# Patient Record
Sex: Male | Born: 1985 | Hispanic: Yes | Marital: Single | Smoking: Current every day smoker
Health system: Southern US, Community
[De-identification: ages and names within clinical notes are randomized; demographics above are authoritative.]

## PROBLEM LIST (undated history)

## (undated) DIAGNOSIS — F1010 Alcohol abuse, uncomplicated: Secondary | ICD-10-CM

---

## 2015-12-29 ENCOUNTER — Ambulatory Visit (INDEPENDENT_AMBULATORY_CARE_PROVIDER_SITE_OTHER): Payer: Self-pay

## 2015-12-29 ENCOUNTER — Ambulatory Visit (INDEPENDENT_AMBULATORY_CARE_PROVIDER_SITE_OTHER): Payer: Self-pay | Admitting: Family Medicine

## 2015-12-29 VITALS — BP 174/108 | HR 84 | Temp 99.3°F | Resp 20 | Ht 63.78 in | Wt 153.0 lb

## 2015-12-29 DIAGNOSIS — F10230 Alcohol dependence with withdrawal, uncomplicated: Secondary | ICD-10-CM

## 2015-12-29 DIAGNOSIS — R002 Palpitations: Secondary | ICD-10-CM

## 2015-12-29 DIAGNOSIS — Z789 Other specified health status: Secondary | ICD-10-CM

## 2015-12-29 DIAGNOSIS — F10930 Alcohol use, unspecified with withdrawal, uncomplicated: Secondary | ICD-10-CM

## 2015-12-29 LAB — POCT URINALYSIS DIP (MANUAL ENTRY)
BILIRUBIN UA: NEGATIVE
BILIRUBIN UA: NEGATIVE
Blood, UA: NEGATIVE
GLUCOSE UA: NEGATIVE
LEUKOCYTES UA: NEGATIVE
NITRITE UA: NEGATIVE
Protein Ur, POC: 100 — AB
Spec Grav, UA: 1.015
Urobilinogen, UA: 1
pH, UA: 8.5

## 2015-12-29 LAB — POCT CBC
GRANULOCYTE PERCENT: 74 % (ref 37–80)
HEMATOCRIT: 47 % (ref 43.5–53.7)
Hemoglobin: 15.9 g/dL (ref 14.1–18.1)
LYMPH, POC: 1.9 (ref 0.6–3.4)
MCH, POC: 31.2 pg (ref 27–31.2)
MCHC: 33.9 g/dL (ref 31.8–35.4)
MCV: 92 fL (ref 80–97)
MID (cbc): 0.6 (ref 0–0.9)
MPV: 7.9 fL (ref 0–99.8)
POC GRANULOCYTE: 7.3 — AB (ref 2–6.9)
POC LYMPH %: 19.5 % (ref 10–50)
POC MID %: 6.5 % (ref 0–12)
Platelet Count, POC: 318 10*3/uL (ref 142–424)
RBC: 5.11 M/uL (ref 4.69–6.13)
RDW, POC: 12.5 %
WBC: 9.8 10*3/uL (ref 4.6–10.2)

## 2015-12-29 LAB — GLUCOSE, POCT (MANUAL RESULT ENTRY): POC Glucose: 115 mg/dl — AB (ref 70–99)

## 2015-12-29 MED ORDER — DIAZEPAM 5 MG PO TABS: 5.0000 mg | ORAL_TABLET | Freq: Two times a day (BID) | ORAL | Status: AC

## 2015-12-29 NOTE — Patient Instructions (Signed)
    Porque has recibido [employer], usted recibir una factura de [employer]. Pngase en contacto con [facility] radiologa en [phone] con preguntas o inquietudes acerca de su factura. Nuestro personal de facturacin no ser capaz de ayudarle con las preguntas

## 2015-12-29 NOTE — Progress Notes (Signed)
12/30/2015 1:18 PM   DOB: [birth year] / MRN: [id number]  SUBJECTIVE:  Johnny Gross is a 30 y.o. male presenting for palpitations that he associates with chest pain.  States he has been drinking excessively for fun over the last two weeks and feels these symptoms started after that. Associates mild nausea, HA and SOB.  No exertional component to his symptoms.  Feels this problem is worsening.  His last drink was two days ago.       He has No Known Allergies.   He  has no past medical history on file.    He  reports that he has been smoking.  He does not have any smokeless tobacco history on file. He reports that he drinks about 30.0 oz of alcohol per week. He reports that he does not use illicit drugs. He  has no sexual activity history on file. The patient  has no past surgical history on file.  His family history includes Diabetes in his father, maternal grandmother, and mother.  Review of Systems  Constitutional: Negative for fever and chills.  Eyes: Negative for blurred vision.  Respiratory: Negative for cough.   Cardiovascular: Positive for chest pain and palpitations. Negative for leg swelling.  Gastrointestinal: Negative for nausea.  Musculoskeletal: Positive for myalgias.  Skin: Negative for itching and rash.  Neurological: Negative for dizziness and headaches.    Problem list and medications reviewed and updated by myself where necessary, and exist elsewhere in the encounter.   OBJECTIVE:  BP 174/108 mmHg  Pulse 84  Temp(Src) 99.3 F (37.4 C) (Oral)  Resp 20  Ht 5' 3.78" (1.62 m)  Wt 153 lb (69.4 kg)  BMI 26.44 kg/m2  SpO2 98%  Physical Exam  Constitutional: He is oriented to person, place, and time. He appears well-developed. He does not appear ill.  HENT:  Right Ear: External ear normal.  Left Ear: External ear normal.  Eyes: Conjunctivae and EOM are normal. Pupils are equal, round, and reactive to light.  Cardiovascular: Normal rate.   Pulmonary/Chest: Effort  normal.  Abdominal: He exhibits no distension. There is no tenderness.  Musculoskeletal: Normal range of motion.  Neurological: He is alert and oriented to person, place, and time. No cranial nerve deficit. Coordination normal.  Skin: Skin is warm and dry. He is not diaphoretic.  Psychiatric: He has a normal mood and affect.  Nursing note and vitals reviewed.   Results for orders placed or performed in visit on 12/29/15 (from the past 72 hour(s))  POCT CBC     Status: Abnormal   Collection Time: 12/29/15  8:38 PM  Result Value Ref Range   WBC 9.8 4.6 - 10.2 K/uL   Lymph, poc 1.9 0.6 - 3.4   POC LYMPH PERCENT 19.5 10 - 50 %L   MID (cbc) 0.6 0 - 0.9   POC MID % 6.5 0 - 12 %M   POC Granulocyte 7.3 (A) 2 - 6.9   Granulocyte percent 74.0 37 - 80 %G   RBC 5.11 4.69 - 6.13 M/uL   Hemoglobin 15.9 14.1 - 18.1 g/dL   HCT, POC 40.9 81.1 - 53.7 %   MCV 92.0 80 - 97 fL   MCH, POC 31.2 27 - 31.2 pg   MCHC 33.9 31.8 - 35.4 g/dL   RDW, POC 91.4 %   Platelet Count, POC 318 142 - 424 K/uL   MPV 7.9 0 - 99.8 fL  POCT glucose (manual entry)     Status: Abnormal  Collection Time: 12/29/15  8:38 PM  Result Value Ref Range   POC Glucose 115 (A) 70 - 99 mg/dl  POCT urinalysis dipstick     Status: Abnormal   Collection Time: 12/29/15  8:39 PM  Result Value Ref Range   Color, UA yellow yellow   Clarity, UA clear clear   Glucose, UA negative negative   Bilirubin, UA negative negative   Ketones, POC UA negative negative   Spec Grav, UA 1.015    Blood, UA negative negative   pH, UA 8.5    Protein Ur, POC =100 (A) negative   Urobilinogen, UA 1.0    Nitrite, UA Negative Negative   Leukocytes, UA Negative Negative    ASSESSMENT AND PLAN  Johnny Gross was seen today for palpitations, anxiety, dizziness and insomnia.  Diagnoses and all orders for this visit:  Heart palpitations: Secondary to problem 3. EKG and RADS normal.  Pressure elevated.    -     EKG 12-Lead -     POCT CBC -     POCT glucose  (manual entry) -     DG Chest 2 View; Future -     COMPLETE METABOLIC PANEL WITH GFR -     TSH -     POCT urinalysis dipstick -     Lipase  Heavy drinker (HCC)  Alcohol withdrawal, uncomplicated (HCC):  Negative for tremor at this time.  Will cover him with Valium for the next ten days.  RTC in 3-4 days for recheck.    Other orders -     diazepam (VALIUM) 5 MG tablet; Take 1 tablet (5 mg total) by mouth every 12 (twelve) hours. For five days and then take 0.5 tabs every twelve hours for five days.    The patient was advised to call or return to clinic if he does not see an improvement in symptoms or to seek the care of the closest emergency department if he worsens with the above plan.   Deliah Boston, MHS, PA-C [facility] 12/30/2015 1:18 PM

## 2015-12-30 LAB — COMPLETE METABOLIC PANEL WITH GFR
ALT: 72 U/L — ABNORMAL HIGH (ref 9–46)
AST: 39 U/L (ref 10–40)
Albumin: 5 g/dL (ref 3.6–5.1)
Alkaline Phosphatase: 82 U/L (ref 40–115)
BUN: 7 mg/dL (ref 7–25)
CHLORIDE: 98 mmol/L (ref 98–110)
CO2: 26 mmol/L (ref 20–31)
Calcium: 10.4 mg/dL — ABNORMAL HIGH (ref 8.6–10.3)
Creat: 0.52 mg/dL — ABNORMAL LOW (ref 0.60–1.35)
GFR, Est African American: >89 mL/min (ref >=60)
GFR, Est Non African American: >89 mL/min (ref >=60)
GLUCOSE: 116 mg/dL — AB (ref 65–99)
POTASSIUM: 4.6 mmol/L (ref 3.5–5.3)
SODIUM: 136 mmol/L (ref 135–146)
Total Bilirubin: 1.1 mg/dL (ref 0.2–1.2)
Total Protein: 8.2 g/dL — ABNORMAL HIGH (ref 6.1–8.1)

## 2015-12-30 LAB — TSH: TSH: 2.542 u[IU]/mL (ref 0.350–4.500)

## 2015-12-30 LAB — LIPASE: Lipase: 12 U/L (ref 7–60)

## 2016-01-06 NOTE — Progress Notes (Signed)
Patient ID: Johnny Gross, male   DOB: 11/10/1986, 30 y.o.   MRN: [id number] Reviewed documentation, EKG, and xray and agree w/ assessment and plan. Norberto Sorenson, MD MPH

## 2016-02-15 ENCOUNTER — Emergency Department (HOSPITAL_COMMUNITY)
Admission: EM | Admit: 2016-02-15 | Discharge: 2016-02-15 | Disposition: A | Discharge status: Home / Self Care | Payer: Self-pay | Attending: Emergency Medicine | Admitting: Emergency Medicine

## 2016-02-15 ENCOUNTER — Encounter (HOSPITAL_COMMUNITY): Payer: Self-pay | Admitting: Emergency Medicine

## 2016-02-15 DIAGNOSIS — R002 Palpitations: Secondary | ICD-10-CM | POA: Insufficient documentation

## 2016-02-15 DIAGNOSIS — F1010 Alcohol abuse, uncomplicated: Secondary | ICD-10-CM | POA: Insufficient documentation

## 2016-02-15 DIAGNOSIS — R110 Nausea: Secondary | ICD-10-CM | POA: Insufficient documentation

## 2016-02-15 DIAGNOSIS — R450 Nervousness: Secondary | ICD-10-CM | POA: Insufficient documentation

## 2016-02-15 DIAGNOSIS — F17200 Nicotine dependence, unspecified, uncomplicated: Secondary | ICD-10-CM | POA: Insufficient documentation

## 2016-02-15 HISTORY — DX: Alcohol abuse, uncomplicated: F10.10

## 2016-02-15 LAB — COMPREHENSIVE METABOLIC PANEL
ALBUMIN: 4.6 g/dL (ref 3.5–5.0)
ALK PHOS: 86 U/L (ref 38–126)
ALT: 44 U/L (ref 17–63)
AST: 41 U/L (ref 15–41)
Anion gap: 12 (ref 5–15)
BILIRUBIN TOTAL: 0.9 mg/dL (ref 0.3–1.2)
BUN: 12 mg/dL (ref 6–20)
CO2: 27 mmol/L (ref 22–32)
CREATININE: 0.76 mg/dL (ref 0.61–1.24)
Calcium: 9.9 mg/dL (ref 8.9–10.3)
Chloride: 99 mmol/L — ABNORMAL LOW (ref 101–111)
GFR calc Af Amer: >60 mL/min (ref >60)
GLUCOSE: 131 mg/dL — AB (ref 65–99)
POTASSIUM: 4 mmol/L (ref 3.5–5.1)
SODIUM: 138 mmol/L (ref 135–145)
TOTAL PROTEIN: 8 g/dL (ref 6.5–8.1)

## 2016-02-15 LAB — ETHANOL: Alcohol, Ethyl (B): <5 mg/dL (ref <5)

## 2016-02-15 LAB — CBC WITH DIFFERENTIAL/PLATELET
Basophils Absolute: 0 10*3/uL (ref 0.0–0.1)
Basophils Relative: 0 %
EOS ABS: 0.2 10*3/uL (ref 0.0–0.7)
EOS PCT: 2 %
HCT: 45.9 % (ref 39.0–52.0)
Hemoglobin: 15.6 g/dL (ref 13.0–17.0)
LYMPHS ABS: 2.5 10*3/uL (ref 0.7–4.0)
Lymphocytes Relative: 26 %
MCH: 31.2 pg (ref 26.0–34.0)
MCHC: 34 g/dL (ref 30.0–36.0)
MCV: 91.8 fL (ref 78.0–100.0)
MONO ABS: 0.7 10*3/uL (ref 0.1–1.0)
MONOS PCT: 8 %
Neutro Abs: 6.2 10*3/uL (ref 1.7–7.7)
Neutrophils Relative %: 64 %
PLATELETS: 269 10*3/uL (ref 150–400)
RBC: 5 MIL/uL (ref 4.22–5.81)
RDW: 12 % (ref 11.5–15.5)
WBC: 9.7 10*3/uL (ref 4.0–10.5)

## 2016-02-15 LAB — LIPASE, BLOOD: LIPASE: 22 U/L (ref 11–51)

## 2016-02-15 MED ORDER — ONDANSETRON 4 MG PO TBDP
4.0000 mg | ORAL_TABLET | Freq: Once | ORAL | Status: AC
  Administered 2016-02-15: 4 mg via ORAL
  Filled 2016-02-15: qty 1

## 2016-02-15 MED ORDER — ONDANSETRON HCL 4 MG PO TABS: 4.0000 mg | ORAL_TABLET | Freq: Four times a day (QID) | ORAL | Status: AC

## 2016-02-15 MED ORDER — LORAZEPAM 1 MG PO TABS
1.0000 mg | ORAL_TABLET | Freq: Once | ORAL | Status: AC
  Administered 2016-02-15: 1 mg via ORAL
  Filled 2016-02-15: qty 1

## 2016-02-15 NOTE — ED Notes (Signed)
Pt. reports anxiety attack " I feel nervous"  this evening , calm at triage , denies hallucinations .

## 2016-02-15 NOTE — ED Provider Notes (Signed)
CSN: [id number]     Arrival date & time 02/15/16  0052 History   First MD Initiated Contact with Patient 02/15/16 [phone]     Chief Complaint  Patient presents with  . Anxiety   (Consider location/radiation/quality/duration/timing/severity/associated sxs/prior Treatment) Patient is a 30 y.o. male presenting with anxiety. The history is provided by the patient. No language interpreter was used.  Anxiety Associated symptoms include nausea. Pertinent negatives include no vomiting.    [name] is a 30 year old male with no significant past medical history who presents for feeling nervous the palpitations since yesterday afternoon. He reports drinking 12 beers prior to this. He states that he does not drink daily [facility] He is only drank alcohol 3 times since but has drank a lot of alcohol at once. He reports feeling this way 2-3 times previously and was evaluated in urgent care and sent home. He was able to tolerate fluids and eat yesterday without vomiting but says that he has nauseous now. He also reports that his palpitations have since resolved. He denies any drug use. He denies any recent illness, fever, chills, shortness of breath, abdominal pain, vomiting, diarrhea.  Past Medical History  Diagnosis Date  . Alcohol abuse    History reviewed. No pertinent past surgical history. Family History  Problem Relation Age of Onset  . Diabetes Mother   . Diabetes Father   . Diabetes Maternal Grandmother    Social History  Substance Use Topics  . Smoking status: Current Every Day Smoker -- 1 years  . Smokeless tobacco: None  . Alcohol Use: Yes    Review of Systems  Cardiovascular: Positive for palpitations.  Gastrointestinal: Positive for nausea. Negative for vomiting.  All other systems reviewed and are negative.     Allergies  Review of patient's allergies indicates no known allergies.  Home Medications   Prior to Admission medications   Medication Sig Start  Date End Date Taking? Authorizing Provider  diazepam (VALIUM) 5 MG tablet Take 1 tablet (5 mg total) by mouth every 12 (twelve) hours. For five days and then take 0.5 tabs every twelve hours for five days. 12/29/15   Ofilia NeasMichael L Clark, PA-C  ondansetron (ZOFRAN) 4 MG tablet Take 1 tablet (4 mg total) by mouth every 6 (six) hours. 02/15/16   Tayelor Osborne Patel-Mills, PA-C   BP 108/68 mmHg  Pulse 69  Temp(Src) 97.8 F (36.6 C) (Oral)  Resp 18  Ht 5\' 5"  (1.651 m)  Wt 68.04 kg  BMI 24.96 kg/m2  SpO2 95% Physical Exam  Constitutional: He is oriented to person, place, and time. He appears well-developed and well-nourished. No distress.  HENT:  Head: Normocephalic and atraumatic.  Eyes: Right conjunctiva is injected. Left conjunctiva is injected.  Bilateral injected conjunctiva. No hemorrhage.  Neck: Normal range of motion. Neck supple.  Cardiovascular: Normal rate, regular rhythm and normal heart sounds.   No murmur heard. Regular rate and rhythm. No tachycardia or murmur.  Pulmonary/Chest: Effort normal and breath sounds normal. No respiratory distress. He has no wheezes. He has no rales.  Lungs clear to auscultation bilaterally.  Abdominal: Soft. Normal appearance. He exhibits no distension. There is no tenderness. There is no rebound and no guarding.  No abdominal tenderness to palpation. No guarding or rebound. No distention.  Musculoskeletal: Normal range of motion.  No tremor. Ambulatory with steady gait.  Neurological: He is alert and oriented to person, place, and time.  Skin: Skin is warm and dry. He is not  diaphoretic.  Nursing note and vitals reviewed.   ED Course  Procedures (including critical care time) Labs Review Labs Reviewed  COMPREHENSIVE METABOLIC PANEL - Abnormal; Notable for the following:    Chloride 99 (*)    Glucose, Bld 131 (*)    All other components within normal limits  CBC WITH DIFFERENTIAL/PLATELET  LIPASE, BLOOD  ETHANOL  URINE RAPID DRUG SCREEN, HOSP  PERFORMED    Imaging Review No results found. I have personally reviewed and evaluated these lab results as part of my medical decision-making.   EKG Interpretation   Date/Time:  Sunday February 15 2016 08:19:20 EDT Ventricular Rate:  76 PR Interval:  180 QRS Duration: 82 QT Interval:  355 QTC Calculation: 399 R Axis:   54 Text Interpretation:  Sinus rhythm ST elev, probable normal early repol  pattern No previous ECGs available Confirmed by Manus Gunning  MD, STEPHEN  [phone]) on 02/15/2016 8:27:30 AM      MDM   Final diagnoses:  Nervousness  Alcohol abuse  Patient presents for nervousness, palpitations, and nausea since yesterday. He reports drinking 12 beers prior to this. Denies any use of drugs or liquor. Vitals stable and patient is well-appearing. I ordered labs to evaluate liver function and pancreatic enzymes. All labs are unremarkable. I do not believe patient is in DTs. His EKG is not concerning.  I discussed findings with the patient. I explained that he should stay well-hydrated. I also discussed not binge drinking. Return precautions were thoroughly discussed. Patient agrees with plan. Filed Vitals:   02/15/16 0945 02/15/16 0958  BP: 108/68   Pulse: 69   Temp:  97.8 F (36.6 C)  Resp: [id number], PA-C 02/15/16 1322  Loren Racer, MD 02/16/16 [phone]

## 2016-02-15 NOTE — Discharge Instructions (Signed)
Consumo excesivo de alcohol y nutricin (Alcohol Abuse and Nutrition) El consumo excesivo de alcohol es cualquier patrn de consumo de alcohol que perjudique la salud, las relaciones o [place]. Este trastorno Orthoptist forma en que el organismo descompone y absorbe los nutrientes de los [place], lo que causa el funcionamiento anormal del hgado. Adems, muchas personas que consumen alcohol en exceso no ingieren la cantidad suficiente de carbohidratos, protenas, grasas, vitaminas y minerales. Esto puede causar desnutricin y la falta de nutrientes (carencias nutritivas), lo que tal vez derive en otras complicaciones. Los nutrientes que suelen ser deficitarios en las personas que consumen alcohol en exceso incluyen lo siguiente:  Vitaminas.  VitaminaA, que se almacena en el hgado y es importante para la visin, el metabolismo y la capacidad para combatir las infecciones (inmunidad).  VitaminasB, que incluyen las vitaminas tales como el folato, la tiamina y la niacina, importantes en el crecimiento y la conservacin de las clulas nuevas.  VitaminaC, que desempea un papel importante en la absorcin del hierro, la cicatrizacin de las heridas y la inmunidad.  VitaminaD, que es producida por el hgado y que tambin se puede obtener de los alimentos. La vitaminaD es necesaria para que el organismo absorba y use el calcio.  Minerales.  Calcio. Es importante para los [place], la actividad cardaca y el funcionamiento de los vasos sanguneos (actividad cardiovascular).  Hierro. Es importante para la [place], los msculos y el funcionamiento del sistema nervioso.  Magnesio. Desempea un papel importante en el funcionamiento muscular y nervioso, y [place] a Sales executive nivel de azcar en la sangre y la presin arterial.  Zinc. Es importante para el normal funcionamiento del sistema nervioso y el aparato digestivo (tubo digestivo). La nutricin es un componente fundamental del  tratamiento para el consumo excesivo de alcohol. El mdico o el nutricionista trabajarn con usted para disear un plan que pueda ayudar a que el organismo recupere los nutrientes y a Occupational psychologist. EN QU CONSISTE EL PLAN? El nutricionista Airline pilot un plan de alimentacin especfico en funcin de su afeccin y de otras complicaciones que pueda Warehouse manager. Generalmente, un plan de alimentacin incluye lo siguiente:  Neomia Dear dieta equilibrada.  Cereales: de 6 a 8 onzas por Futures trader.  Verduras: de 2 a Scientist, research (life sciences).  Nils Pyle: de 1 a 2tazas Air cabin crew.  Carne y otras protenas: de 5 a Health visitor.  Lcteos: de 2 a Scientist, research (life sciences).  Suplementos de vitaminas y minerales QU DEBO SABER ACERCA DEL ALCOHOL Y LA NUTRICIN?  Consuma alimentos con alto contenido de antioxidantes, como uvas, frutos rojos, frutos secos, t verde y verduras de [employer] oscuro y anaranjado. Esto puede ayudar a Field seismologist de la agresin que sufre el hgado cuando se consume alcohol.  No ingiera alimentos y bebidas con alto contenido de [place] y International aid/development worker. Las bebidas gaseosas azucaradas, las colaciones saladas y los dulces contienen caloras vacas, lo que significa que carecen de nutrientes importantes, como las protenas, las fibras y las vitaminas.  Ingiera comidas y colaciones con frecuencia. Intente hacer 5 o 6comidas pequeas por da.  Coma diariamente una variedad de frutas y verduras frescas, lo que ayudar a aportar una gran cantidad de [place], [place] y vitaminas a su dieta.  Birdie Hopes agua y otros lquidos transparentes. Intente beber por lo menos de 48 a 64onzas (de 1,5 a 2l) de Warehouse manager.  Si es vegetariano, consuma diferentes alimentos con alto contenido de protenas. Combine los granos integrales con  las protenas vegetales en las comidas y las colaciones, para obtener el mximo aporte de nutrientes de los alimentos. Por ejemplo, coma arroz con frijoles, unte una tostada  integral con [place] de man o ingiera avena con semillas de girasol.  Antes de cocinarlos, remoje los frijoles y los granos integrales durante la noche. Esto puede ayudar a que [employer] nutrientes con ms facilidad.  Incluya en su dieta alimentos fortificados con vitaminas y minerales. Los alimentos que suelen estar fortificados son, Adella Nissen, la [place], el jugo de [place], los cereales y el pan.  Si est desnutrido, el nutricionista puede recomendarle una dieta hipercalrica con alto contenido de protenas. Esto puede incluir lo siguiente:  Tommi Rumps 2000 a 3000caloras (kilocaloras) por Futures trader.  De 70 a 100gramos de protenas Air cabin crew.  El mdico puede recomendarle un suplemento nutricional bebible completo, que puede ayudar a que el organismo recupere las caloras, las protenas y las vitaminas. En funcin de su afeccin, tal vez le indiquen que consuma este suplemento, en lugar de que coma o lo agregue a las comidas.  Limite el consumo de cafena. En lugar de caf y t negro, tome caf descafeinado y t de hierbas.  Coma alimentos variados con alto contenido de cidos grasos omega, entre ellos, pescado, frutos secos y Surveyor, minerals, y porotos de soja. Estos alimentos pueden ayudar a que el hgado se recupere y, adems, a Freight forwarder de nimo.  Ciertos medicamentos pueden causar cambios en el apetito, el sentido del gusto y [place]. Trabaje con el mdico y el nutricionista para hacer ajustes en los medicamentos que toma y en el plan de alimentacin.  Incluya en su rutina diaria otras opciones para llevar un estilo de vida saludable.  Haga actividad fsica.  Duerma lo suficiente.  Dedique tiempo a Education officer, environmental las [employer].  Si no puede ingerir la cantidad suficiente de comida y caloras por boca, el mdico puede recomendar que le coloquen una sonda de alimentacin. Se trata de una sonda que se coloca a travs de la Clinical cytogeneticist y Radio producer al  [employer] and An[employer]ity Association. Las bebidas nutricionales complementarias pueden administrarse a travs de la sonda de Paediatric nurse, a fin de ayudarlo a que [employer] nutrientes que necesita.  Tome los suplementos vitamnicos o minerales como se lo haya indicado el mdico. QU ALIMENTOS PUEDO COMER? Cereales Pasta enriquecida. Arroz enriquecido. Pan integral fortificado. Cereales integrales fortificados. Cebada. Arroz integral. Baldo Ash. Mijo. Verduras Verduras frescas, congeladas y Primary school teacher. [place]aca. Col rizada. Alcachofas. Zanahorias. Zapallo y calabaza. Batatas. Brcoli. Repollo. Pepinos. Tomates. Pimientos morrones. Judas verdes. Guisantes. Maz. Frutas Todas las frutas frescas y Academic librarian. Frutos rojos. Uvas. Mango. Papaya. Guayaba. Cerezas. Manzanas. Bananas. Duraznos. Ciruelas. Pia. Sanda. Meln. Naranjas. Aguacate. Carnes y otras fuentes de protenas Hgado de res. Carnes magras. Cerdo. Pollo fresco y Soil scientist. Pescado fresco. Susie Cassette. Sardinas. Atn en lata. Camarones. Huevos con las yemas. Frutos secos y semillas. Mantequilla de man. Porotos y lentejas. Soja. Tofu. Lcteos Leche entera, con bajo contenido de Antar[place] (the [place] 60 deg [place]) y sin Holiday representative. Yogur entero, con bajo contenido de [place]) y sin grasa. [employer]. P[employer] Industries. Quesos duros y blandos. CHS Inc. T de hierbas. Caf descafeinado. T verde descafeinado. Jugo de frutas al 100%. Jugo de verduras al 100%. Batidos instantneos para el desayuno. Condimentos [place]. Mayonesa. Mostaza. Condimento para ensalada. Salsa barbacoa. Dulces y postres Helado sin azcar. Flan sin azcar. Gelatina sin azcar. Grasas y [employer]. Aceite vegetal, aceite de lino, aceite de oliva y [place]. Otros Batidos nutricionales  completos. Barras de protenas. Goma de Theatre manager sin International aid/development worker. Los artculos mencionados arriba pueden no ser [employer]on de las bebidas o los alimentos recomendados. Comunquese con el nutricionista para conocer ms opciones. QU  ALIMENTOS NO SE RECOMIENDAN? Cereales Cereales para el desayuno endulzados con azcar. Avena instantnea saborizada. Panes fritos. Verduras Verduras empanizadas o fritas. Nils Pyle Frutas desecadas con agregado de azcar. Frutas confitadas. Frutas enlatadas con almbar. Carnes y otras fuentes de protenas Carnes empanizadas o fritas. Lcteos Leches saborizadas. Requesn frito o palitos de queso fritos. Bebidas Alcohol. Refrescos endulzados con azcar. T endulzado con azcar. T y caf con cafena. Condimentos Azcar. Miel. Miel de agave. Melaza. Dulces y [employer] Social Services The United Ways 211 is a great source of information about community services available.  Access by dialing 2-1-1 from anywhere in [place], or by website -  PooledIncome.pl.   The [facility] Outpatient Counseling/Substance Abuse Adult The United Ways 211 is a great source of information about community services available.  Access by dialing 2-1-1 from anywhere in [place], or by website -  PooledIncome.pl.   Other Local Resources (Updated 12/2015)  Crisis Hotlines   Services     Area Served  [employer]  Crisis Hotline, available 24 hours a day, 7 days a week: 504-607-4652 [facility], [place]   [facility]  Crisis Hotline, available 24 hours a day, 7 days a week: [phone] [facility], [place]  [facility]  Suicide Prevention Hotline, available 24 hours a day, 7 days a week: 508-223-7254 [facility], [place]  [employer], available 24 hours a day, 7 days a week: [phone] [facility] [place], [place]   [facility] Access to [employer], available 24 hours a day, 7 days a week: 8302248534 All   [facility]  Crisis Hotline, available 24 hours a day, 7 days a week: 865-354-2863 All   Other Local Resources (Updated 12/2015)  Outpatient Counseling/ Substance Abuse  Programs  Services     Address and Phone Number  ADS (Alcohol and Drug Services)   Options include Individual counseling, group counseling, intensive outpatient program (several hours a day, several days a week)  Offers depression assessments  Provides methadone maintenance program [phone] [address] [place], [place] [address]   [facility]   Offers partial hospitalization/day treatment and DUI/DWI programs  [employer], private insurance [phone] [address] [place], [place] [address]  [facility]    Services include intensive outpatient program (several hours a day, several days a week), outpatient treatment, DUI/DWI services, family education  Also has some services specifically for Intel transitional housing  [phone] [address] [place], [place] [address]     [facility]  [employer], private pay, and private insurance [phone] [address] [place], [place] [address]  [employer] of Care  Services include individual counseling, substance abuse intensive outpatient program (several hours a day, several days a week), day treatment  Delene Loll, Medicaid, private insurance [id number] [address] [place], [place] [address]  [facility]   Offers substance abuse intensive outpatient program (several hours a day, several days a week), partial hospitalization program [phone] [address] [place], [place] [address]  [phone] [address] [place], [place] [address]  [phone] [address] [place], [place] [address]  [phone] [place], [place] [address]  [facility]  Individual counseling only  Accepts private insurance only [phone] [address] [place], [place] [address]  [facility]one  Clinic  Methadone maintenance program [phone] [address] [place], [place] [address]  [facility]  Walk-In Clinic providing substance abuse and mental health counseling  Accepts Medicaid, Medicare, private insurance  Offers sliding scale for uninsured [phone] [address] [place], [place]   [facility] [place], [employer].  Offers individual counseling, and intensive in-home services [phone] [address] [place], [place] [address]  [facility] individual counseling, family counseling, group therapy, domestic violence counseling, consumer credit counseling  Accepts Medicare, Medicaid, private insurance  Offers sliding scale for uninsured [phone] [address] [place], [place] [address]  [phone] [facility], [address] [place], [place] [address]  [facility]  Offers individual, family and group counseling  3 locations - [place], [place], and [place]  [phone]  [address] [place], [place] [address]  [address] [place], [place] [address]  [address] [place], [place] [address]  [facility]    Offers psychiatric assessment, 8-week Intensive Outpatient Program (several hours a day, several times a week, daytime or evenings), early recovery group, family Program, medication management  Private pay or private insurance only [phone], or  [phone] [address] [place], [place] [address]  [place] [employer] individual, couples and family counseling  Accepts Medicaid, private insurance, and sliding scale for uninsured [phone] [address] [place], [place] [address]  Len Blalock, MD  Individual counseling  Private insurance [phone] [address] [place], [place] [address]  [facility]   Offers assessment, substance abuse treatment, and behavioral health treatment [phone] [address] [place], [place] [address]  [facility]  Individual counseling  Accepts private insurance [phone] [address] [place], [place] [address]  [facility]  Individual counseling  Delene Loll, private insurance [phone] [address] [place], [place] [address]  [facility]    Offers intensive outpatient program (several hours a day, several times a week)  Private pay, private insurance [phone] [facility] [place], [place]  [facility]  Individual counseling  Medicare, private insurance [phone] [address] [place], [place] [address]  [facility]    Offers intensive outpatient program (several hours a day, several times a week) and partial hospitalization program [phone] [address] [place], [place] [address]  Emerson Monte, MD  Individual counseling [phone] [address] [place], [place] [address]  [facility]  Offers Christian counseling to individuals, couples, and families  Accepts Medicare and private insurance; offers sliding scale for uninsured [phone] [address] [place], [place] [address]  [facility]  [facility] counseling [phone] [address] [place], [place] [address]  [facility] crisis counseling, individual counseling, group therapy, in-home therapy, domestic violence services, day treatment, DWI services, Administrator, arts (CST), Assertive Community Treatment Team (ACTT), substance abuse Intensive Outpatient Program (several hours a day, several times a week)  2 locations - [place] and [place] [phone] [address] [place], [place] [address]  [phone] [address] [place], [place] [address]  [facility]     Individual counseling and group therapy  Accepts private insurance,  [place], [place] [phone] [address] [place], [place]  [facility]  Offers  individual and family counseling  Offers LGBTQ services  Accepts private insurance and private pay [phone] [address] [place], [place] [phone]  [facility]    Offers individual counseling, group therapy, and outpatient detox  Accepts private insurance [phone] [address] [place], [place]  [facility]  Individual counseling  Accepts Medicare, private insurance [phone] [address] [place], [place] [phone]  [facility]  Individual counseling  Accepts Medicare, private insurance [phone] [address] [place], [place] [id number]  [facility]   Offers substance abuse Intensive Outpatient Program (several hours a day, several times a week) [phone], or [phone] [place], [place]

## 2017-07-28 IMAGING — CR DG CHEST 2V
2 series · 2 of 2 positions shown · non-contrast
Comparison: None.

CLINICAL DATA: Chest pain, heart palpitations

EXAM:
CHEST  2 VIEW

[PA]
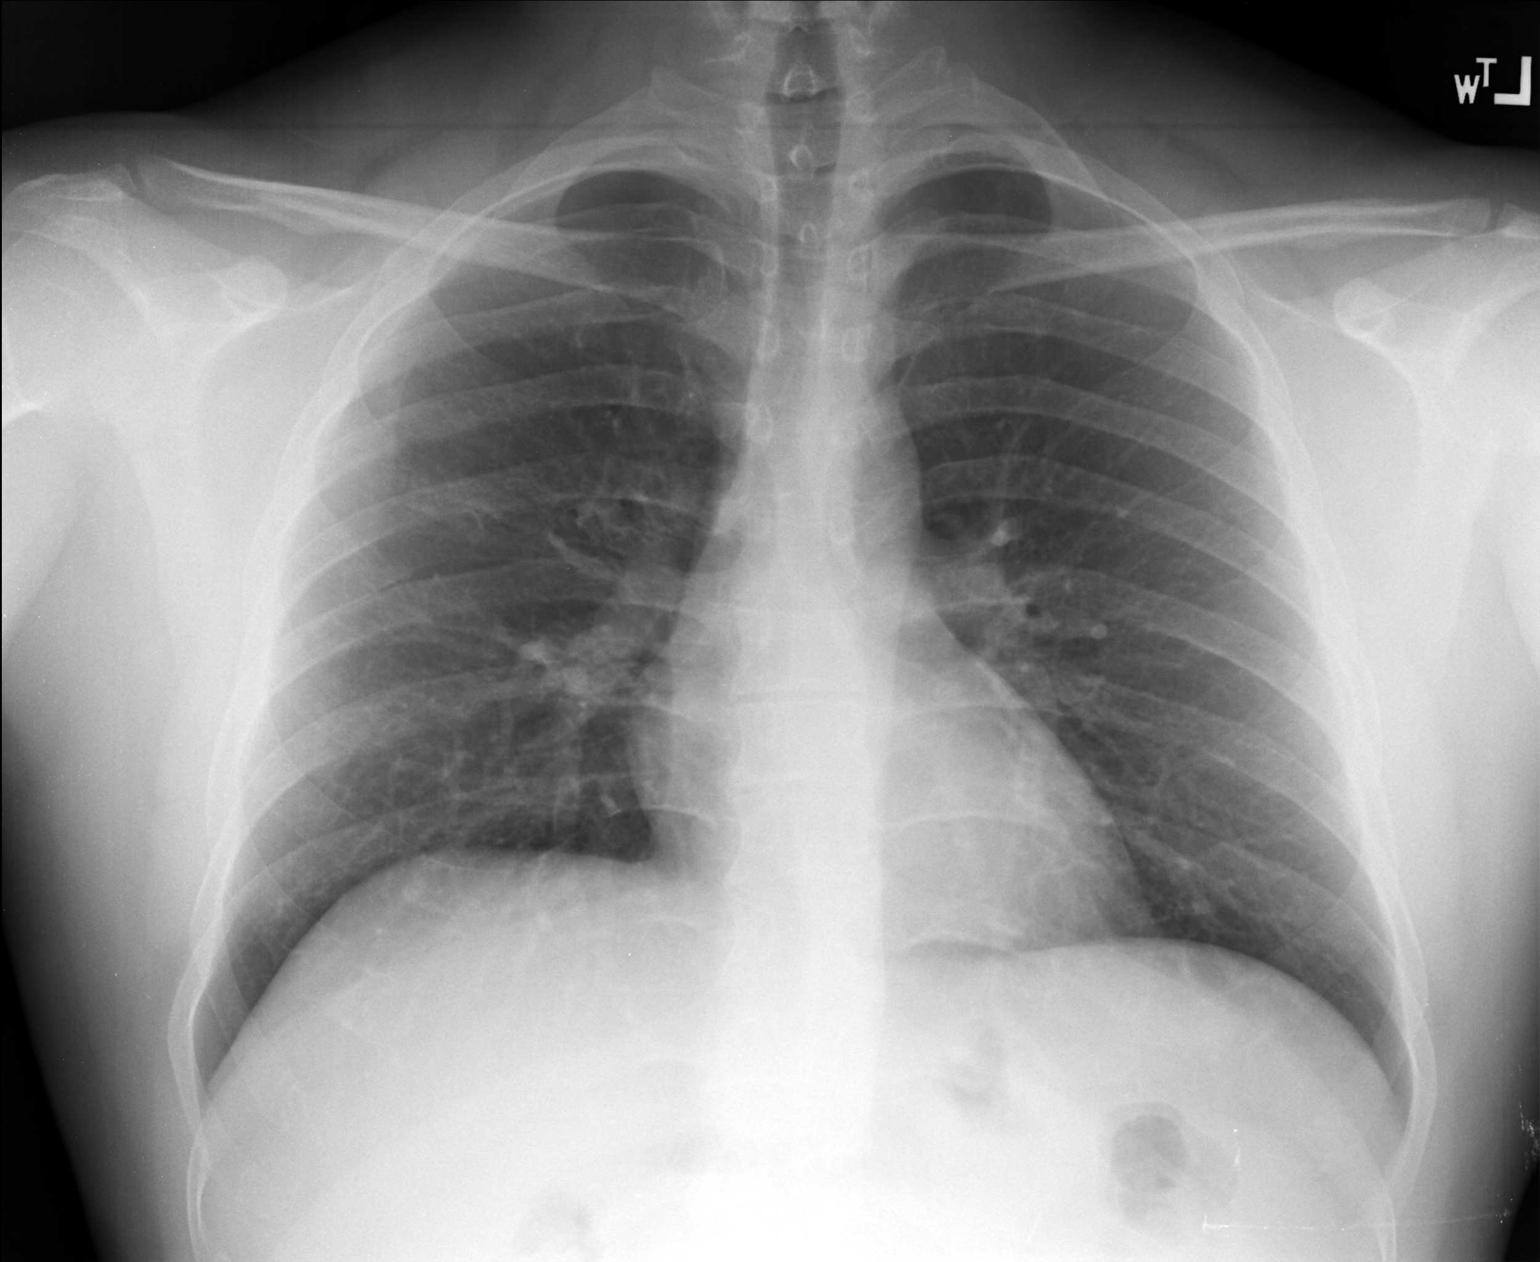

[lateral]
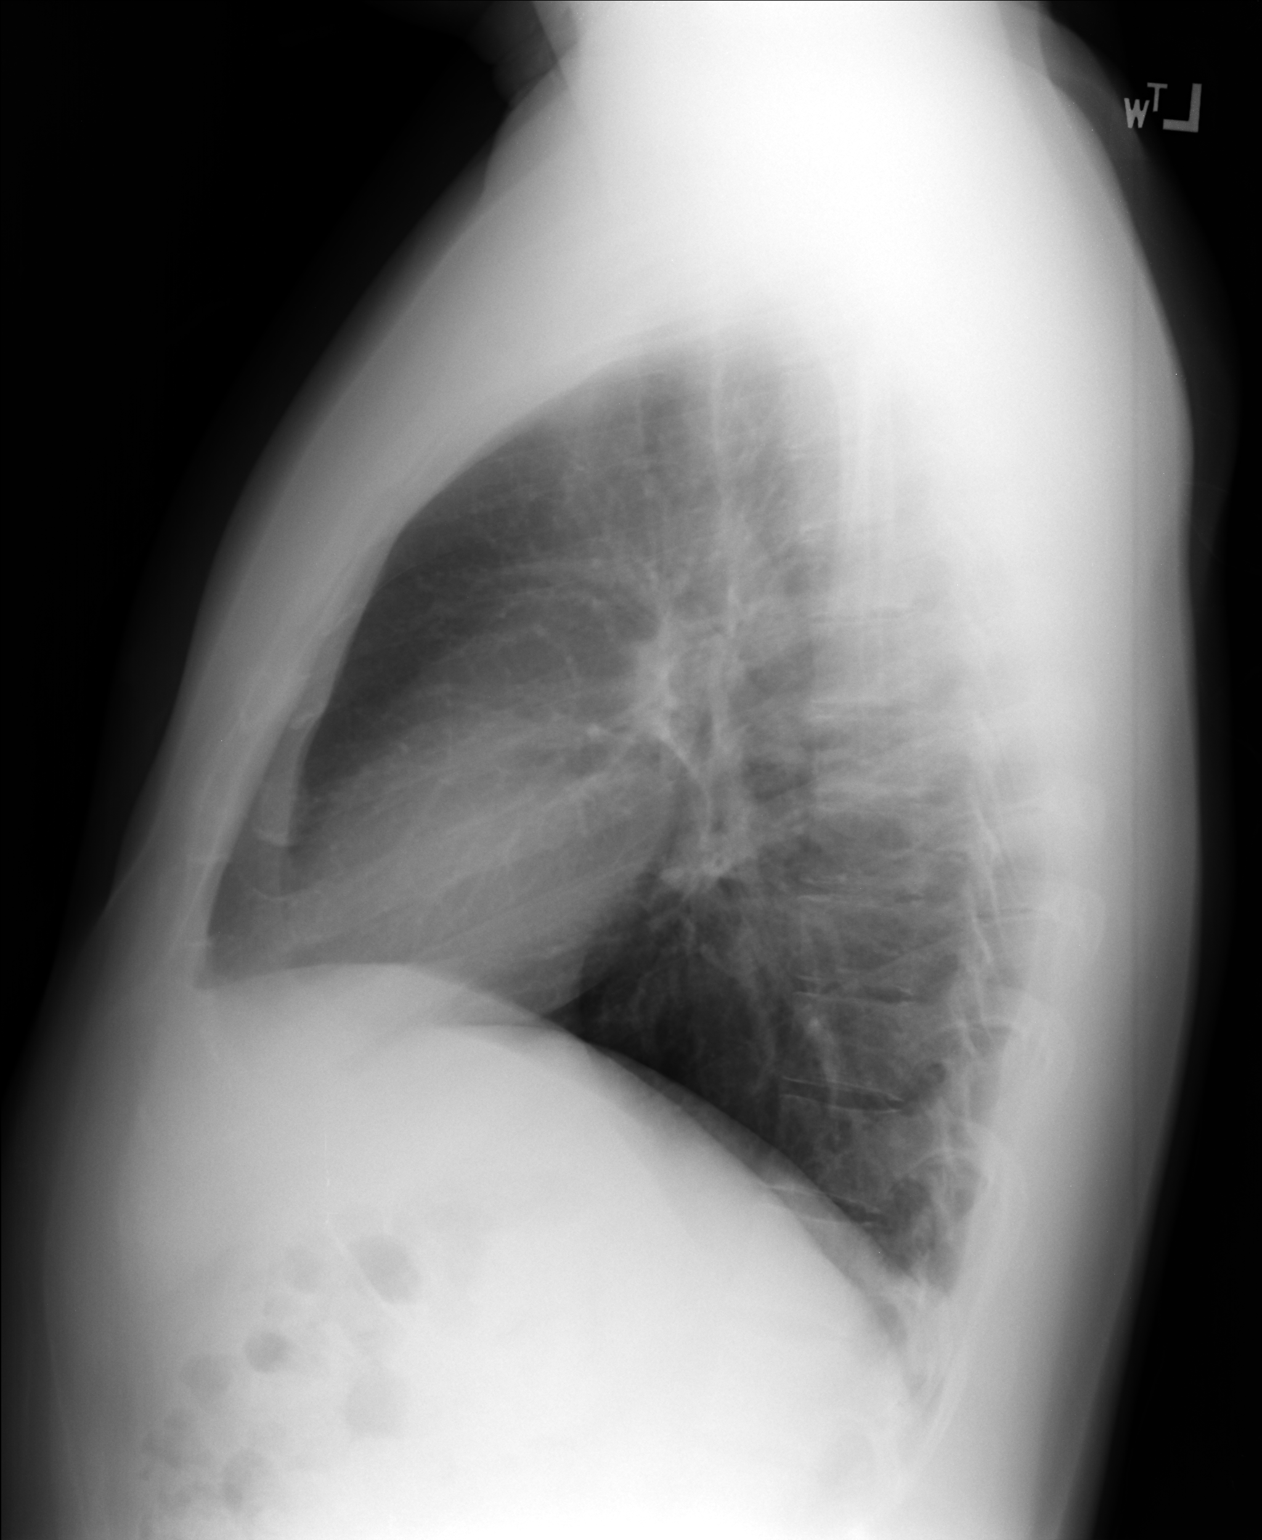

[2 of 2 positions shown; findings below may reference images not displayed]

FINDINGS: The heart size and mediastinal contours are within normal limits.
Both lungs are clear. The visualized skeletal structures are
unremarkable.
IMPRESSION: No active cardiopulmonary disease.
